# Patient Record
Sex: Male | Born: 1990
Health system: Southern US, Community
[De-identification: ages and names within clinical notes are randomized; demographics above are authoritative.]

## PROBLEM LIST (undated history)

## (undated) HISTORY — PX: WISDOM TOOTH EXTRACTION: SHX21

## (undated) HISTORY — PX: ORCHIOPEXY: SHX479

---
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
## 2014-08-13 ENCOUNTER — Telehealth: Payer: Self-pay | Admitting: Medical

## 2014-08-13 NOTE — Telephone Encounter (Signed)
Toniann Fail did not get message in her box so she is going to give to [employer]

## 2014-08-13 NOTE — Telephone Encounter (Signed)
Received records from previous doc, [facility]. I am sending records back to be reviewed and the forwarded to shane.

## 2014-08-19 ENCOUNTER — Encounter: Payer: Self-pay | Admitting: Medical

## 2014-08-19 ENCOUNTER — Ambulatory Visit (INDEPENDENT_AMBULATORY_CARE_PROVIDER_SITE_OTHER): Payer: No Typology Code available for payment source | Admitting: Medical

## 2014-08-19 VITALS — BP 130/70 | HR 68 | Temp 97.6°F | Resp 16 | Ht 74.0 in | Wt 233.0 lb

## 2014-08-19 DIAGNOSIS — Z113 Encounter for screening for infections with a predominantly sexual mode of transmission: Secondary | ICD-10-CM

## 2014-08-19 DIAGNOSIS — K644 Residual hemorrhoidal skin tags: Secondary | ICD-10-CM

## 2014-08-19 DIAGNOSIS — Z23 Encounter for immunization: Secondary | ICD-10-CM

## 2014-08-19 DIAGNOSIS — Z0000 Encounter for general adult medical examination without abnormal findings: Secondary | ICD-10-CM

## 2014-08-19 DIAGNOSIS — K6289 Other specified diseases of anus and rectum: Secondary | ICD-10-CM

## 2014-08-19 DIAGNOSIS — B360 Pityriasis versicolor: Secondary | ICD-10-CM

## 2014-08-19 LAB — CBC
HCT: 42.8 % (ref 39.0–52.0)
HEMOGLOBIN: 15.1 g/dL (ref 13.0–17.0)
MCH: 30 pg (ref 26.0–34.0)
MCHC: 35.3 g/dL (ref 30.0–36.0)
MCV: 85.1 fL (ref 78.0–100.0)
Platelets: 330 10*3/uL (ref 150–400)
RBC: 5.03 MIL/uL (ref 4.22–5.81)
RDW: 13.1 % (ref 11.5–15.5)
WBC: 4.5 10*3/uL (ref 4.0–10.5)

## 2014-08-19 LAB — POCT URINALYSIS DIPSTICK
BILIRUBIN UA: NEGATIVE
GLUCOSE UA: NEGATIVE
Ketones, UA: NEGATIVE
LEUKOCYTES UA: NEGATIVE
NITRITE UA: NEGATIVE
RBC UA: NEGATIVE
SPEC GRAV UA: 1.025
Urobilinogen, UA: NEGATIVE
pH, UA: 5

## 2014-08-19 MED ORDER — HYDROCORTISONE 2.5 % RE CREA: 1.0000 "application " | TOPICAL_CREAM | Freq: Two times a day (BID) | RECTAL | Status: DC

## 2014-08-19 MED ORDER — KETOCONAZOLE 2 % EX SHAM: 1.0000 "application " | MEDICATED_SHAMPOO | CUTANEOUS | Status: DC

## 2014-08-19 MED ORDER — HYDROCORTISONE ACETATE 25 MG RE SUPP: 25.0000 mg | Freq: Two times a day (BID) | RECTAL | Status: DC

## 2014-08-19 MED ORDER — TERBINAFINE HCL 250 MG PO TABS: 250.0000 mg | ORAL_TABLET | Freq: Every day | ORAL | Status: DC

## 2014-08-19 NOTE — Patient Instructions (Signed)
  Thank you for giving me the opportunity to serve you today.    Your diagnosis today includes: Encounter Diagnoses  Name Primary?  . Routine general medical examination at a health care facility Yes  . Tinea versicolor   . Rectal pain   . External hemorrhoid   . Need for prophylactic vaccination and inoculation against influenza   . Screen for STD (sexually transmitted disease)      Specific recommendations today include:  For fungal rash, begin Lamisil tablet daily;  Use the Nizoral shampoo twice per week.  Apply to head, chest, torso, arms, leave on for 10 minutes, then rinse off with water . Do this twice weekly.  Rectal pain - begin Anusol suppository twice daily for the next 5-7 days.  You may use the cream to the anus (proctosol) as needed.    Clean well after stooling.  If constipated, use OTC stool softener occasionally.  Drink plenty of water daily, get good fiber in the diet daily.  Use condoms.  We will call with lab results.   Recheck pending labs, follow up in 1 months

## 2014-08-19 NOTE — Progress Notes (Signed)
Subjective:   HPI  Ethan Rowland is a 23 y.o. male who presents for a complete physical.  New patient today.  Establishing care here today along with his twin brother who I also saw today.   Lives with mother and brother. Father unknown.  Has been in usual state of health   Preventative care: Last ophthalmology visit:n/a Last dental visit:yes Prior vaccinations: TD or Tdap:2009 Influenza:08/19/14  Concerns: Hurts to have BM, usually pain starts a little after BMs.  Started few months with painful straining, saw some bright red blood. No prior hemorrhoid. Lately has rectal pain after every BM, but no mucous, no blood now, no obvious hemorrhoid, no constipation, usually has 1 BM daily.  No prior similar.  Worried he has a fissure.     Has concerns of ongoing rash since high school.  Rash flares up from time to time worse but is always present. Initially started on the neck and upper back but now includes the whole torso neck and arms. Has used A&D ointment with no improvement  Not currently sexually active, greater than one sexual partner lifetime, mostly uses condoms but not always, last STD testing years ago  Reviewed their medical, surgical, family, social, medication, and allergy history and updated chart as appropriate.  History reviewed. No pertinent past medical history.  Past Surgical History  Procedure Laterality Date  . Orchiopexy      bilat, in childhood for undescended testicles  . Wisdom tooth extraction      History   Social History  . Marital Status: Single    Spouse Name: N/A    Number of Children: N/A  . Years of Education: N/A   Occupational History  . Not on file.   Social History Main Topics  . Smoking status: Former Smoker -- 1 years  . Smokeless tobacco: Not on file  . Alcohol Use: No  . Drug Use: No  . Sexual Activity: Not on file   Other Topics Concern  . Not on file   Social History Narrative   [employer] for Designer, fashion/clothing, freshman.    Exercise 4 days per week each.   No girlfriend.  Lives with mom and brother.    History reviewed. No pertinent family history.  Current outpatient prescriptions:hydrocortisone (ANUSOL-HC) 2.5 % rectal cream, Place 1 application rectally 2 (two) times daily., Disp: 30 g, Rfl: 0;  hydrocortisone (ANUSOL-HC) 25 MG suppository, Place 1 suppository (25 mg total) rectally 2 (two) times daily., Disp: 12 suppository, Rfl: 0;  ketoconazole (NIZORAL) 2 % shampoo, Apply 1 application topically 2 (two) times a week., Disp: 120 mL, Rfl: 0 terbinafine (LAMISIL) 250 MG tablet, Take 1 tablet (250 mg total) by mouth daily., Disp: 30 tablet, Rfl: 0  No Known Allergies    Review of Systems Constitutional: -fever, -chills, -sweats, -unexpected weight change, -decreased appetite, -fatigue Allergy: -sneezing, -itching, -congestion Dermatology: -changing moles, +rash, -lumps ENT: -runny nose, -ear pain, -sore throat, -hoarseness, -sinus pain, -teeth pain, - ringing in ears, -hearing loss, -nosebleeds Cardiology: -chest pain, -palpitations, -swelling, -difficulty breathing when lying flat, -waking up short of breath Respiratory: -cough, -shortness of breath, -difficulty breathing with exercise or exertion, -wheezing, -coughing up blood Gastroenterology: -abdominal pain, -nausea, -vomiting, -diarrhea, -constipation, -blood in stool, -changes in bowel movement, -difficulty swallowing or eating Hematology: -bleeding, -bruising  Musculoskeletal: -joint aches, -muscle aches, -joint swelling, -back pain, -neck pain, -cramping, -changes in gait Ophthalmology: denies vision changes, eye redness, itching, discharge Urology: -burning with urination, -difficulty urinating, -blood  in urine, -urinary frequency, -urgency, -incontinence Neurology: -headache, -weakness, -tingling, -numbness, -memory loss, -falls, -dizziness Psychology: -depressed mood, -agitation, -sleep problems     Objective:   Physical Exam  BP  130/70  Pulse 68  Temp(Src) 97.6 F (36.4 C) (Oral)  Resp 16  Ht  (1.88 m)  Wt 233 lb (105.688 kg)  BMI 29.90 kg/m2  General appearance: alert, no distress, WD/WN, AA male, pleasant Skin: diffuse patches of round, various sized flat lesions throughout neck, torso anterior and posterior, antecubital regions, all suggestive of tinea versicolor, no other worrisome lesions HEENT: normocephalic, conjunctiva/corneas normal, sclerae anicteric, PERRLA, EOMi, nares patent, no discharge or erythema, pharynx normal Oral cavity: MMM, tongue normal, teeth in good repair Neck: supple, no lymphadenopathy, no thyromegaly, no masses, normal ROM, no bruits Chest: non tender, normal shape and expansion Heart: RRR, normal S1, S2, no murmurs Lungs: CTA bilaterally, no wheezes, rhonchi, or rales Abdomen: +bs, soft, non tender, non distended, no masses, no hepatomegaly, no splenomegaly, no bruits Back: non tender, normal ROM, no scoliosis Musculoskeletal: upper extremities non tender, no obvious deformity, normal ROM throughout, lower extremities non tender, no obvious deformity, normal ROM throughout Extremities: no edema, no cyanosis, no clubbing Pulses: 2+ symmetric, upper and lower extremities, normal cap refill Neurological: alert, oriented x 3, CN2-12 intact, strength normal upper extremities and lower extremities, sensation normal throughout, DTRs 2+ throughout, no cerebellar signs, gait normal Psychiatric: normal affect, behavior normal, pleasant  GU: normal male external genitalia, circumcised, surgical scar noted of inferior testes, otherwise nontender, no masses, no hernia, no lymphadenopathy Rectal: anus inflamed appearing mildly, small somewhat tender external hemorrhoid, there is a pit at lower end of gluteal cleft, no other abnormality   Assessment and Plan :    Encounter Diagnoses  Name Primary?  . Routine general medical examination at a health care facility Yes  . Tinea versicolor    . Rectal pain   . External hemorrhoid   . Need for prophylactic vaccination and inoculation against influenza   . Need for HPV vaccination   . Screen for STD (sexually transmitted disease)     Physical exam - discussed healthy lifestyle, diet, exercise, preventative care, vaccinations, and addressed their concerns.  Advise he see dentist yearly, discussed hygiene, safety, college work and career goals. Baseline labs today for screening, reported history of impaired glucose  Tinea versicolor-discussed the findings, diagnosis, treatment, begin Nizoral shampoo twice weekly, Lamisil oral tablets daily, recheck in one month  Rectal pain, external hemorrhoid-discussed good hygiene, avoidance of constipation, stool softener as needed over-the-counter, begin short-term Anusol suppository, hydrocortisone cream rectally as needed, discussed good water intake and fiber intake in the diet, recheck in 1 month, sooner when necessary  Discussed condom use, safe sex, prevention, STDs, means of transmission, prevention of unwanted pregnancy, respect for women.  STD screening labs today.   Patient gives consent for testing.  We will call with lab results.   Counseled on the influenza virus vaccine.  Vaccine information sheet given.  Influenza vaccine given after consent obtained.  Counseled on the Human Papilloma virus vaccine.  Vaccine information sheet given.  HPV #3 vaccine given after consent obtained.     Follow-up pending labs, 20mo on tinea and rectal pain.

## 2014-08-20 LAB — COMPREHENSIVE METABOLIC PANEL
ALBUMIN: 4.8 g/dL (ref 3.5–5.2)
ALT: 18 U/L (ref 0–53)
AST: 15 U/L (ref 0–37)
Alkaline Phosphatase: 84 U/L (ref 39–117)
BUN: 9 mg/dL (ref 6–23)
CALCIUM: 10 mg/dL (ref 8.4–10.5)
CO2: 27 mEq/L (ref 19–32)
CREATININE: 0.89 mg/dL (ref 0.50–1.35)
Chloride: 104 mEq/L (ref 96–112)
GLUCOSE: 90 mg/dL (ref 70–99)
Potassium: 4.1 mEq/L (ref 3.5–5.3)
Sodium: 138 mEq/L (ref 135–145)
TOTAL PROTEIN: 7 g/dL (ref 6.0–8.3)
Total Bilirubin: 0.4 mg/dL (ref 0.2–1.2)

## 2014-08-20 LAB — LIPID PANEL
CHOLESTEROL: 152 mg/dL (ref 0–200)
HDL: 43 mg/dL (ref >39)
LDL Cholesterol: 73 mg/dL (ref 0–99)
TRIGLYCERIDES: 180 mg/dL — AB (ref <150)
Total CHOL/HDL Ratio: 3.5 Ratio
VLDL: 36 mg/dL (ref 0–40)

## 2014-08-20 LAB — HIV ANTIBODY (ROUTINE TESTING W REFLEX): HIV 1&2 Ab, 4th Generation: NONREACTIVE

## 2014-08-20 LAB — RPR

## 2014-08-20 LAB — GC/CHLAMYDIA PROBE AMP
CT Probe RNA: NEGATIVE
GC Probe RNA: NEGATIVE

## 2014-08-20 LAB — TSH: TSH: 1.993 u[IU]/mL (ref 0.350–4.500)

## 2014-09-09 ENCOUNTER — Encounter: Payer: Self-pay | Admitting: Medical

## 2014-09-10 NOTE — Telephone Encounter (Signed)
09/10/2014 °

## 2017-09-04 ENCOUNTER — Encounter: Payer: Self-pay | Admitting: Medical

## 2017-09-04 ENCOUNTER — Ambulatory Visit (INDEPENDENT_AMBULATORY_CARE_PROVIDER_SITE_OTHER): Payer: BLUE CROSS/BLUE SHIELD | Admitting: Medical

## 2017-09-04 VITALS — BP 132/80 | HR 87 | Ht 74.0 in | Wt 243.6 lb

## 2017-09-04 DIAGNOSIS — Z0000 Encounter for general adult medical examination without abnormal findings: Secondary | ICD-10-CM

## 2017-09-04 DIAGNOSIS — Z7189 Other specified counseling: Secondary | ICD-10-CM

## 2017-09-04 DIAGNOSIS — H579 Unspecified disorder of eye and adnexa: Secondary | ICD-10-CM | POA: Diagnosis not present

## 2017-09-04 DIAGNOSIS — R109 Unspecified abdominal pain: Secondary | ICD-10-CM

## 2017-09-04 DIAGNOSIS — B360 Pityriasis versicolor: Secondary | ICD-10-CM

## 2017-09-04 DIAGNOSIS — Z113 Encounter for screening for infections with a predominantly sexual mode of transmission: Secondary | ICD-10-CM | POA: Diagnosis not present

## 2017-09-04 DIAGNOSIS — Z7185 Encounter for immunization safety counseling: Secondary | ICD-10-CM

## 2017-09-04 LAB — POCT URINALYSIS DIP (PROADVANTAGE DEVICE)
BILIRUBIN UA: NEGATIVE
Blood, UA: NEGATIVE
GLUCOSE UA: NEGATIVE mg/dL
Ketones, POC UA: NEGATIVE mg/dL
LEUKOCYTES UA: NEGATIVE
NITRITE UA: NEGATIVE
Specific Gravity, Urine: 1.03
Urobilinogen, Ur: POSITIVE
pH, UA: 6 (ref 5.0–8.0)

## 2017-09-04 MED ORDER — KETOCONAZOLE 2 % EX SHAM: 1.0000 "application " | MEDICATED_SHAMPOO | CUTANEOUS | 1 refills | Status: DC

## 2017-09-04 NOTE — Progress Notes (Signed)
Subjective:   HPI  Ethan Rowland is a 26 y.o. male who presents for a complete physical.    Concerns: sexually active, same partner last 3 months.  No current worrisome symptoms.   He reports intermittent tingling in abdomen.  Worse when he is quiet, but its there daily for past year.    Gets stomach turning, unusual feeling in stomach.  Denies worry, anxiety.  No diarrhea, no constipation, no blood in stool, no back pain.   Eats 3 times daily.  Mainly drinks water.   Eats a lot of fast food.  Not exercising.  Works for [employer], Education officer, environmental.    Has some new brown spots in his white of eye  Reviewed their medical, surgical, family, social, medication, and allergy history and updated chart as appropriate.  No past medical history on file.  Past Surgical History:  Procedure Laterality Date  . ORCHIOPEXY     bilat, in childhood for undescended testicles  . WISDOM TOOTH EXTRACTION      Social History   Social History  . Marital status: Single    Spouse name: N/A  . Number of children: N/A  . Years of education: N/A   Occupational History  . Not on file.   Social History Main Topics  . Smoking status: Former Smoker    Years: 1.00  . Smokeless tobacco: Never Used  . Alcohol use No  . Drug use: No  . Sexual activity: Not on file   Other Topics Concern  . Not on file   Social History Narrative   [employer] for Designer, fashion/clothing, freshman.   Exercise 4 days per week each.   No girlfriend.  Lives with mom and brother.    No family history on file.   Current Outpatient Prescriptions:  .  ketoconazole (NIZORAL) 2 % shampoo, Apply 1 application topically 2 (two) times a week. (Patient not taking: Reported on 09/04/2017), Disp: 120 mL, Rfl: 0 .  terbinafine (LAMISIL) 250 MG tablet, Take 1 tablet (250 mg total) by mouth daily. (Patient not taking: Reported on 09/04/2017), Disp: 30 tablet, Rfl: 0  No Known Allergies    Review of Systems Constitutional: -fever, -chills,  -sweats, -unexpected weight change, -decreased appetite, -fatigue Allergy: -sneezing, -itching, -congestion Dermatology: -changing moles, +rash, -lumps ENT: -runny nose, -ear pain, -sore throat, -hoarseness, -sinus pain, -teeth pain, - ringing in ears, -hearing loss, -nosebleeds Cardiology: -chest pain, -palpitations, -swelling, -difficulty breathing when lying flat, -waking up short of breath Respiratory: -cough, -shortness of breath, -difficulty breathing with exercise or exertion, -wheezing, -coughing up blood Gastroenterology: -abdominal pain, -nausea, -vomiting, -diarrhea, -constipation, -blood in stool, -changes in bowel movement, -difficulty swallowing or eating Hematology: -bleeding, -bruising  Musculoskeletal: -joint aches, -muscle aches, -joint swelling, -back pain, -neck pain, -cramping, -changes in gait Ophthalmology: denies vision changes, eye redness, itching, discharge Urology: -burning with urination, -difficulty urinating, -blood in urine, -urinary frequency, -urgency, -incontinence Neurology: -headache, -weakness, -tingling, -numbness, -memory loss, -falls, -dizziness Psychology: -depressed mood, -agitation, -sleep problems     Objective:   Physical Exam  BP 132/80   Pulse 87   Ht  (1.88 m)   Wt 243 lb 9.6 oz (110.5 kg)   SpO2 99%   BMI 31.28 kg/m   General appearance: alert, no distress, WD/WN, AA male, pleasant Skin: few round 3mm - 1cm patches along back  suggestive of tinea versicolor, no other worrisome lesions HEENT: normocephalic, conjunctiva/corneas normal, sclerae anicteric, few brown flat patches of bilat sclera,  PERRLA, EOMi,  nares patent, no discharge or erythema, pharynx normal Oral cavity: MMM, tongue normal, teeth in good repair Neck: supple, no lymphadenopathy, no thyromegaly, no masses, normal ROM, no bruits Chest: non tender, normal shape and expansion Heart: RRR, normal S1, S2, no murmurs Lungs: CTA bilaterally, no wheezes, rhonchi, or  rales Abdomen: +bs, soft, non tender, non distended, no masses, no hepatomegaly, no splenomegaly, no bruits Back: non tender, normal ROM, no scoliosis Musculoskeletal: upper extremities non tender, no obvious deformity, normal ROM throughout, lower extremities non tender, no obvious deformity, normal ROM throughout Extremities: no edema, no cyanosis, no clubbing Pulses: 2+ symmetric, upper and lower extremities, normal cap refill Neurological: alert, oriented x 3, CN2-12 intact, strength normal upper extremities and lower extremities, sensation normal throughout, DTRs 2+ throughout, no cerebellar signs, gait normal Psychiatric: normal affect, behavior normal, pleasant  GU: normal male external genitalia, circumcised, surgical scar noted of inferior testes, otherwise nontender, no masses, no hernia, no lymphadenopathy Rectal: deferred   Assessment and Plan :    Encounter Diagnoses  Name Primary?  . Routine general medical examination at a health care facility Yes  . Screen for STD (sexually transmitted disease)   . Abdominal discomfort   . Eye lesion   . Vaccine counseling   . Tinea versicolor     Physical exam - discussed healthy lifestyle, diet, exercise, preventative care, vaccinations, and addressed their concerns.    Tinea versicolor-discussed the findings, diagnosis, treatment, begin Nizoral shampoo twice weekly for a month then prn  Discussed condom use, safe sex, prevention, STDs, means of transmission, prevention of unwanted pregnancy  See eye doctor regarding eye lesions  Abdominal discomfort - etiology unclear, likely related to the fact he eats fast food daily.  counseled on healthy diet, exercise.   F/u pending labs  Vaccines: Reviewed [facility]   Declines flu shot  Due for Td next year.  Follow-up pending labs  [name] was seen today for annual exam.  Diagnoses and all orders for this visit:  Routine general medical examination at a health care facility -      POCT Urinalysis DIP (Proadvantage Device) -     CBC -     Comprehensive metabolic panel -     Hemoglobin A1c -     HIV antibody -     RPR -     C. trachomatis/N. gonorrhoeae RNA  Screen for STD (sexually transmitted disease) -     HIV antibody -     RPR -     C. trachomatis/N. gonorrhoeae RNA  Abdominal discomfort  Eye lesion  Vaccine counseling  Tinea versicolor

## 2017-09-04 NOTE — Patient Instructions (Signed)
Ophthalmology Dr. Glenford Peers [address] Felipa [place], [place] [address] [phone]   [facility] Dr. Gelene Mink [address], [place] [place], [place] [address]  [phone] [email or web address]   Vincenza Hews, M.D. Susanne Greenhouse, O.D. [address] [place], [place] [address] Medical telephone: [phone] Optical telephone: [phone]   Health Maintenance, Male A healthy lifestyle and preventive care is important for your health and wellness. Ask your health care provider about what schedule of regular examinations is right for you. What should I know about weight and diet? Eat a Healthy Diet  Eat plenty of vegetables, fruits, whole grains, low-fat dairy products, and lean protein.  Do not eat a lot of foods high in solid fats, added sugars, or salt.  Maintain a Healthy Weight Regular exercise can help you achieve or maintain a healthy weight. You should:  Do at least 150 minutes of exercise each week. The exercise should increase your heart rate and make you sweat (moderate-intensity exercise).  Do strength-training exercises at least twice a week.  Watch Your Levels of Cholesterol and Blood Lipids  Have your blood tested for lipids and cholesterol every 5 years starting at 24 years of age. If you are at high risk for heart disease, you should start having your blood tested when you are 24 years old. You may need to have your cholesterol levels checked more often if: ? Your lipid or cholesterol levels are high. ? You are older than 24 years of age. ? You are at high risk for heart disease.  What should I know about cancer screening? Many types of cancers can be detected early and may often be prevented. Lung Cancer  You should be screened every year for lung cancer if: ? You are a current smoker who has smoked for at least 30 years. ? You are a former smoker who has quit within the past 15 years.  Talk to your health care  provider about your screening options, when you should start screening, and how often you should be screened.  Colorectal Cancer  Routine colorectal cancer screening usually begins at 24 years of age and should be repeated every 5-10 years until you are 24 years old. You may need to be screened more often if early forms of precancerous polyps or small growths are found. Your health care provider may recommend screening at an earlier age if you have risk factors for colon cancer.  Your health care provider may recommend using home test kits to check for hidden blood in the stool.  A small camera at the end of a tube can be used to examine your colon (sigmoidoscopy or colonoscopy). This checks for the earliest forms of colorectal cancer.  Prostate and Testicular Cancer  Depending on your age and overall health, your health care provider may do certain tests to screen for prostate and testicular cancer.  Talk to your health care provider about any symptoms or concerns you have about testicular or prostate cancer.  Skin Cancer  Check your skin from head to toe regularly.  Tell your health care provider about any new moles or changes in moles, especially if: ? There is a change in a mole's size, shape, or color. ? You have a mole that is larger than a pencil eraser.  Always use sunscreen. Apply sunscreen liberally and repeat throughout the day.  Protect yourself by wearing long sleeves, pants, a wide-brimmed hat, and sunglasses when outside.  What should  I know about heart disease, diabetes, and high blood pressure?  If you are 42-3 years of age, have your blood pressure checked every 3-5 years. If you are 9 years of age or older, have your blood pressure checked every year. You should have your blood pressure measured twice-once when you are at a hospital or clinic, and once when you are not at a hospital or clinic. Record the average of the two measurements. To check your blood pressure  when you are not at a hospital or clinic, you can use: ? An automated blood pressure machine at a pharmacy. ? A home blood pressure monitor.  Talk to your health care provider about your target blood pressure.  If you are between 101-70 years old, ask your health care provider if you should take aspirin to prevent heart disease.  Have regular diabetes screenings by checking your fasting blood sugar level. ? If you are at a normal weight and have a low risk for diabetes, have this test once every three years after the age of 86. ? If you are overweight and have a high risk for diabetes, consider being tested at a younger age or more often.  A one-time screening for abdominal aortic aneurysm (AAA) by ultrasound is recommended for men aged 65-75 years who are current or former smokers. What should I know about preventing infection? Hepatitis B If you have a higher risk for hepatitis B, you should be screened for this virus. Talk with your health care provider to find out if you are at risk for hepatitis B infection. Hepatitis C Blood testing is recommended for:  Everyone born from [birth year] through 1963.  Anyone with known risk factors for hepatitis C.  Sexually Transmitted Diseases (STDs)  You should be screened each year for STDs including gonorrhea and chlamydia if: ? You are sexually active and are younger than 24 years of age. ? You are older than 24 years of age and your health care provider tells you that you are at risk for this type of infection. ? Your sexual activity has changed since you were last screened and you are at an increased risk for chlamydia or gonorrhea. Ask your health care provider if you are at risk.  Talk with your health care provider about whether you are at high risk of being infected with HIV. Your health care provider may recommend a prescription medicine to help prevent HIV infection.  What else can I do?  Schedule regular health, dental, and eye  exams.  Stay current with your vaccines (immunizations).  Do not use any tobacco products, such as cigarettes, chewing tobacco, and e-cigarettes. If you need help quitting, ask your health care provider.  Limit alcohol intake to no more than 2 drinks per day. One drink equals 12 ounces of beer, 5 ounces of wine, or 1 ounces of hard liquor.  Do not use street drugs.  Do not share needles.  Ask your health care provider for help if you need support or information about quitting drugs.  Tell your health care provider if you often feel depressed.  Tell your health care provider if you have ever been abused or do not feel safe at home. This information is not intended to replace advice given to you by your health care provider. Make sure you discuss any questions you have with your health care provider. Document Released: 05/25/2008 Document Revised: 07/26/2016 Document Reviewed: 08/31/2015 Elsevier Interactive Patient Education  [employer].

## 2017-09-05 LAB — C. TRACHOMATIS/N. GONORRHOEAE RNA
C. trachomatis RNA, TMA: NOT DETECTED
N. GONORRHOEAE RNA, TMA: NOT DETECTED

## 2017-09-05 LAB — COMPREHENSIVE METABOLIC PANEL
AG Ratio: 1.9 (calc) (ref 1.0–2.5)
ALBUMIN MSPROF: 4.8 g/dL (ref 3.6–5.1)
ALKALINE PHOSPHATASE (APISO): 51 U/L (ref 40–115)
ALT: 23 U/L (ref 9–46)
AST: 23 U/L (ref 10–40)
BILIRUBIN TOTAL: 0.5 mg/dL (ref 0.2–1.2)
BUN: 10 mg/dL (ref 7–25)
CHLORIDE: 104 mmol/L (ref 98–110)
CO2: 23 mmol/L (ref 20–32)
CREATININE: 0.91 mg/dL (ref 0.60–1.35)
Calcium: 9.7 mg/dL (ref 8.6–10.3)
GLOBULIN: 2.5 g/dL (ref 1.9–3.7)
Glucose, Bld: 86 mg/dL (ref 65–99)
POTASSIUM: 4.2 mmol/L (ref 3.5–5.3)
Sodium: 139 mmol/L (ref 135–146)
Total Protein: 7.3 g/dL (ref 6.1–8.1)

## 2017-09-05 LAB — HIV ANTIBODY (ROUTINE TESTING W REFLEX): HIV: NONREACTIVE

## 2017-09-05 LAB — CBC
HEMATOCRIT: 44.3 % (ref 38.5–50.0)
HEMOGLOBIN: 15.2 g/dL (ref 13.2–17.1)
MCH: 29.3 pg (ref 27.0–33.0)
MCHC: 34.3 g/dL (ref 32.0–36.0)
MCV: 85.5 fL (ref 80.0–100.0)
MPV: 10.6 fL (ref 7.5–12.5)
Platelets: 342 10*3/uL (ref 140–400)
RBC: 5.18 10*6/uL (ref 4.20–5.80)
RDW: 12.1 % (ref 11.0–15.0)
WBC: 5.2 10*3/uL (ref 3.8–10.8)

## 2017-09-05 LAB — RPR: RPR Ser Ql: NONREACTIVE

## 2017-09-05 LAB — HEMOGLOBIN A1C
HEMOGLOBIN A1C: 5.5 %{Hb} (ref <5.7)
Mean Plasma Glucose: 111 (calc)
eAG (mmol/L): 6.2 (calc)

## 2017-09-12 ENCOUNTER — Other Ambulatory Visit (INDEPENDENT_AMBULATORY_CARE_PROVIDER_SITE_OTHER): Payer: BLUE CROSS/BLUE SHIELD

## 2017-09-12 DIAGNOSIS — R8290 Unspecified abnormal findings in urine: Secondary | ICD-10-CM

## 2017-09-12 LAB — POCT URINALYSIS DIP (PROADVANTAGE DEVICE)
BILIRUBIN UA: NEGATIVE
Glucose, UA: NEGATIVE mg/dL
Ketones, POC UA: NEGATIVE mg/dL
Leukocytes, UA: NEGATIVE
NITRITE UA: NEGATIVE
PH UA: 6 (ref 5.0–8.0)
Protein Ur, POC: NEGATIVE mg/dL
RBC UA: NEGATIVE
SPECIFIC GRAVITY, URINE: 1.03
UUROB: NEGATIVE

## 2018-04-29 ENCOUNTER — Encounter (HOSPITAL_COMMUNITY): Payer: Self-pay | Admitting: Emergency Medicine

## 2018-04-29 ENCOUNTER — Ambulatory Visit (HOSPITAL_COMMUNITY)
Admission: EM | Admit: 2018-04-29 | Discharge: 2018-04-29 | Disposition: A | Discharge status: Home / Self Care | Payer: BLUE CROSS/BLUE SHIELD | Attending: Family Medicine | Admitting: Family Medicine

## 2018-04-29 DIAGNOSIS — M25572 Pain in left ankle and joints of left foot: Secondary | ICD-10-CM

## 2018-04-29 DIAGNOSIS — M25571 Pain in right ankle and joints of right foot: Secondary | ICD-10-CM

## 2018-04-29 MED ORDER — NAPROXEN 500 MG PO TABS: 500.0000 mg | ORAL_TABLET | Freq: Two times a day (BID) | ORAL | 0 refills | Status: DC

## 2018-04-29 NOTE — Discharge Instructions (Signed)
Ice, elevation, ace wrap or ankle sleeve for compression and support to ankle/feet. Naproxen twice a day for pain control. Activity as tolerated. Your shoes may need better support while playing basketball.

## 2018-04-29 NOTE — ED Triage Notes (Signed)
Pt sts bilateral foot pain after playing basketball

## 2018-04-29 NOTE — ED Provider Notes (Signed)
[facility]    CSN: [id number] Arrival date & time: 04/29/18  1210     History   Chief Complaint Chief Complaint  Patient presents with  . Foot Pain    HPI RANDI COLLEGE is a 27 y.o. male.   Izaia presents with complaints of bilateral foot pain after he played basketball 5/14. No specific injury. Pain followed on 5/15. Pain is worse if he is stepping up such on a stair, but not with simple ambulation. Denies any previous injury. Denies numbness or tingling. Has not taken any medications for pain. Has not worsened but has not improved. Minimal swelling. Right foot greater than left. Without contributing medical history.      ROS per HPI.      History reviewed. No pertinent past medical history.  Patient Active Problem List   Diagnosis Date Noted  . Screen for STD (sexually transmitted disease) 09/04/2017  . Eye lesion 09/04/2017  . Abdominal discomfort 09/04/2017  . Tinea versicolor 09/04/2017  . Vaccine counseling 09/04/2017    Past Surgical History:  Procedure Laterality Date  . ORCHIOPEXY     bilat, in childhood for undescended testicles  . WISDOM TOOTH EXTRACTION         Home Medications    Prior to Admission medications   Medication Sig Start Date End Date Taking? Authorizing Provider  ketoconazole (NIZORAL) 2 % shampoo Apply 1 application topically 2 (two) times a week. 09/06/17   Tysinger, Kermit Balo, PA-C  naproxen (NAPROSYN) 500 MG tablet Take 1 tablet (500 mg total) by mouth 2 (two) times daily. 04/29/18   Georgetta Haber, NP    Family History History reviewed. No pertinent family history.  Social History Social History   Tobacco Use  . Smoking status: Former Smoker    Years: 1.00  . Smokeless tobacco: Never Used  Substance Use Topics  . Alcohol use: No  . Drug use: No     Allergies   Patient has no known allergies.   Review of Systems Review of Systems   Physical Exam Triage Vital Signs ED Triage Vitals  [04/29/18 1343]  Enc Vitals Group     BP (!) 143/73     Pulse Rate (!) 49     Resp 18     Temp 97.9 F (36.6 C)     Temp Source Oral     SpO2 100 %     Weight      Height      Head Circumference      Peak Flow      Pain Score      Pain Loc      Pain Edu?      Excl. in GC?    No data found.  Updated Vital Signs BP (!) 143/73 (BP Location: Left Arm)   Pulse (!) 49   Temp 97.9 F (36.6 C) (Oral)   Resp 18   SpO2 100%    Physical Exam  Constitutional: He is oriented to person, place, and time. He appears well-developed and well-nourished.  Cardiovascular: Normal rate and regular rhythm.  Pulmonary/Chest: Effort normal and breath sounds normal.  Musculoskeletal:       Right ankle: He exhibits normal range of motion, no swelling, no ecchymosis, no deformity, no laceration and normal pulse. Tenderness. AITFL tenderness found. Achilles tendon normal.       Left ankle: He exhibits normal range of motion, no swelling, no ecchymosis, no deformity, no laceration and normal pulse. Tenderness. AITFL  tenderness found. Achilles tendon normal.       Right foot: Normal.       Left foot: Normal.       Feet:  Mild muscular tenderness to bilateral ankles; without bony tenderness; strong pedal pulses; full ROm to ankle; sensation intact bilaterally; cap refill <2seconds  Neurological: He is alert and oriented to person, place, and time.  Skin: Skin is warm and dry.     UC Treatments / Results  Labs (all labs ordered are listed, but only abnormal results are displayed) Labs Reviewed - No data to display  EKG None  Radiology No results found.  Procedures Procedures (including critical care time)  Medications Ordered in UC Medications - No data to display  Initial Impression / Assessment and Plan / UC Course  I have reviewed the triage vital signs and the nursing notes.  Pertinent labs & imaging results that were available during my care of the patient were reviewed by me and  considered in my medical decision making (see chart for details).     No specific injury. Has not tried any medications for symptoms. Strain vs tendonitis. ACE wraps applied. Ice, elevation, NSAIDs for pain control. Activity as tolerated. Return precautions provided. Patient verbalized understanding and agreeable to plan.  Ambulatory out of clinic without difficulty.    Final Clinical Impressions(s) / UC Diagnoses   Final diagnoses:  Acute bilateral ankle pain     Discharge Instructions     Ice, elevation, ace wrap or ankle sleeve for compression and support to ankle/feet. Naproxen twice a day for pain control. Activity as tolerated. Your shoes may need better support while playing basketball.    ED Prescriptions    Medication Sig Dispense Auth. Provider   naproxen (NAPROSYN) 500 MG tablet Take 1 tablet (500 mg total) by mouth 2 (two) times daily. 30 tablet Georgetta Haber, NP     Controlled Substance Prescriptions [place] Controlled Substance Registry consulted? Not Applicable   Georgetta Haber, NP 04/29/18 1439

## 2018-10-06 ENCOUNTER — Encounter (HOSPITAL_COMMUNITY): Payer: Self-pay | Admitting: *Deleted

## 2018-10-06 ENCOUNTER — Emergency Department (HOSPITAL_COMMUNITY)
Admission: EM | Admit: 2018-10-06 | Discharge: 2018-10-06 | Disposition: A | Discharge status: Home / Self Care | Payer: BLUE CROSS/BLUE SHIELD | Attending: Emergency Medicine | Admitting: Emergency Medicine

## 2018-10-06 ENCOUNTER — Other Ambulatory Visit: Payer: Self-pay

## 2018-10-06 DIAGNOSIS — H6123 Impacted cerumen, bilateral: Secondary | ICD-10-CM | POA: Insufficient documentation

## 2018-10-06 MED ORDER — HYDROGEN PEROXIDE 3 % EX SOLN
CUTANEOUS | Status: AC
  Administered 2018-10-06: 12:00:00
  Filled 2018-10-06: qty 473

## 2018-10-06 NOTE — ED Provider Notes (Signed)
Ethan Rowland is a 27 y.o. male with decreased hearing due to wax in ears.  BP (!) 155/77 (BP Location: Right Arm)   Pulse 70   Temp 98.1 F (36.7 C) (Oral)   Resp 16   Ht 6\' 3"  (1.905 m)   Wt 97.5 kg   SpO2 98%   BMI 26.87 kg/m   Re examined patient after RN irrigated ears. Cerumen removed and patient reports hearing has returned to normal. TM's normal.   Patient to f/u with PCP.    Kerrie Buffalo [place], [place] 10/06/18 1225    Lorre Nick, MD 10/09/18 [phone]

## 2018-10-06 NOTE — ED Provider Notes (Signed)
[facility]EM[facility]ENC[facility]PT Provider Note   CSN: [id number] Arrival date & time: 10/06/18  1047     History   Chief Complaint Chief Complaint  Patient presents with  . Hearing Loss    HPI Ethan Rowland is a 27 y.o. male.  27 year old male complains of decreased hearing in his left ear.  Patient does use Q-tips.  Denies any ear drainage.  No fever.  No treatment used prior to arrival     History reviewed. No pertinent past medical history.  Patient Active Problem List   Diagnosis Date Noted  . Screen for STD (sexually transmitted disease) 09/04/2017  . Eye lesion 09/04/2017  . Abdominal discomfort 09/04/2017  . Tinea versicolor 09/04/2017  . Vaccine counseling 09/04/2017    Past Surgical History:  Procedure Laterality Date  . ORCHIOPEXY     bilat, in childhood for undescended testicles  . WISDOM TOOTH EXTRACTION          Home Medications    Prior to Admission medications   Medication Sig Start Date End Date Taking? Authorizing Provider  ketoconazole (NIZORAL) 2 % shampoo Apply 1 application topically 2 (two) times a week. 09/06/17   Tysinger, Kermit Balo, PA-C  naproxen (NAPROSYN) 500 MG tablet Take 1 tablet (500 mg total) by mouth 2 (two) times daily. 04/29/18   Georgetta Haber, NP    Family History No family history on file.  Social History Social History   Tobacco Use  . Smoking status: Former Smoker    Years: 1.00  . Smokeless tobacco: Never Used  Substance Use Topics  . Alcohol use: No  . Drug use: No     Allergies   Patient has no known allergies.   Review of Systems Review of Systems  All other systems reviewed and are negative.    Physical Exam Updated Vital Signs BP (!) 155/77 (BP Location: Right Arm)   Pulse 70   Temp 98.1 F (36.7 C) (Oral)   Resp 16   Ht 1.905 m (6\' 3" )   Wt 97.5 kg   SpO2 98%   BMI 26.87 kg/m   Physical Exam  Constitutional: He is oriented to person, place, and time. He appears  well-developed and well-nourished.  Non-toxic appearance.  HENT:  Head: Normocephalic and atraumatic.  Right Ear: Decreased hearing is noted.  Cerumen impaction noted at left external ear canal  Eyes: Pupils are equal, round, and reactive to light. Conjunctivae are normal.  Neck: Normal range of motion.  Cardiovascular: Normal rate.  Pulmonary/Chest: Effort normal.  Neurological: He is alert and oriented to person, place, and time.  Skin: Skin is warm and dry.  Psychiatric: He has a normal mood and affect.  Nursing note and vitals reviewed.    ED Treatments / Results  Labs (all labs ordered are listed, but only abnormal results are displayed) Labs Reviewed - No data to display  EKG None  Radiology No results found.  Procedures Procedures (including critical care time)  Medications Ordered in ED Medications - No data to display   Initial Impression / Assessment and Plan / ED Course  I have reviewed the triage vital signs and the nursing notes.  Pertinent labs & imaging results that were available during my care of the patient were reviewed by me and considered in my medical decision making (see chart for details).     pts ear irriagted by nursing and then eloped  Final Clinical Impressions(s) / ED Diagnoses   Final diagnoses:  None    ED Discharge Orders    None       Lorre Nick, MD 10/10/18 [phone]

## 2018-10-06 NOTE — ED Triage Notes (Addendum)
Sudden loss of hearing in left ear this am, large amount of wax dark in color noted in ear

## 2018-10-06 NOTE — ED Notes (Signed)
Bed: WTR6 Expected date:  Expected time:  Means of arrival:  Comments: 

## 2019-02-05 ENCOUNTER — Ambulatory Visit: Payer: BLUE CROSS/BLUE SHIELD | Admitting: Family Medicine

## 2019-02-05 ENCOUNTER — Telehealth: Payer: Self-pay | Admitting: Medical

## 2019-02-05 NOTE — Telephone Encounter (Signed)
Please send refill on the Nizoral shampoo

## 2019-02-05 NOTE — Telephone Encounter (Signed)
Pt came by office to be seen but we had issues with insurance.  Pt just needs refill on Nizoral shampoo.  Pt will check with his mom and see about his insurance because she handles it but would like a refill if possible and will schedule an appointment once he gets things straight with insurance.

## 2019-02-06 ENCOUNTER — Other Ambulatory Visit: Payer: Self-pay

## 2019-02-06 DIAGNOSIS — B360 Pityriasis versicolor: Secondary | ICD-10-CM

## 2019-02-06 MED ORDER — KETOCONAZOLE 2 % EX SHAM: 1.0000 "application " | MEDICATED_SHAMPOO | CUTANEOUS | 1 refills | Status: DC

## 2019-02-06 NOTE — Telephone Encounter (Signed)
Done refill sent

## 2020-01-19 ENCOUNTER — Ambulatory Visit (INDEPENDENT_AMBULATORY_CARE_PROVIDER_SITE_OTHER): Payer: 59 | Admitting: Medical

## 2020-01-19 ENCOUNTER — Encounter: Payer: Self-pay | Admitting: Medical

## 2020-01-19 ENCOUNTER — Other Ambulatory Visit: Payer: Self-pay

## 2020-01-19 VITALS — BP 128/72 | HR 78 | Temp 98.2°F | Ht 74.0 in | Wt 263.6 lb

## 2020-01-19 DIAGNOSIS — L42 Pityriasis rosea: Secondary | ICD-10-CM | POA: Diagnosis not present

## 2020-01-19 MED ORDER — HYDROXYZINE HCL 10 MG PO TABS: 10.0000 mg | ORAL_TABLET | Freq: Two times a day (BID) | ORAL | 0 refills | Status: DC

## 2020-01-19 NOTE — Progress Notes (Signed)
Subjective:  Ethan Rowland is a 29 y.o. male who presents for Chief Complaint  Patient presents with  . Eczema    all over      Here for rash.   Started about 2 weeks ago.  Girlfriend noticed it before he did.  Started somewhere on lower abdomen or back.  Within days had several spots on lower abdomen and low back.    Just now started itching some, mild.     He has used a new soap recently but its a coconut oil based soap without a lot of additives.   No prior eczema.  No recent illness, no fever, no recent sick contacts, no recent contact with rash.  Since rash started he has been using a coconut oil based lotion.  No other aggravating or relieving factors.    No other c/o.  The following portions of the patient's history were reviewed and updated as appropriate: allergies, current medications, past family history, past medical history, past social history, past surgical history and problem list.  ROS Otherwise as in subjective above    Objective: BP 128/72   Pulse 78   Temp 98.2 F (36.8 C)   Ht 6\' 2"  (1.88 m)   Wt 263 lb 9.6 oz (119.6 kg)   SpO2 98%   BMI 33.84 kg/m   General appearance: alert, no distress, well developed, well nourished, African American male Skin: scattered small oval shaped lesions, slight rough, but not raised, no cleared center, throughout mid chest to lower abdomen and even extending to just below hip anterior and posteriorly, lower back with similar scattered rash.  There possibly is one herald patch on left low back.   No erythema, no bulla, no distinct ringworm or tinea pattern.   All lesions seems similar, not various stages.  Suggestive of pityriasis rosea.   Assessment: Encounter Diagnosis  Name Primary?  . Pityriasis rosea Yes     Plan: Discussed findings, potential differential, including dermatitis, eczema, pityriasis rosea, fungus, but seems most consistent with pityriasis rosea.  Patient Instructions  Your rash appears to be most  likely Pityriasis Rosea   Begin Hydroxyzine tablet by mouth, either bedtime only, or up to twice daily for itching and rash  You could use 1/2 tablet in the morning if this makes you sleepy  Avoid really hot showers for the next few weeks until this rash goes away  This rash will likely take a few WEEKS to completely go away  You are not contagious  Topical treatment:  You can either stick with the Rosehip Body Lotion which is Coconut oil based  Or you could use over the counter Aquaphor lotion or Cetaphil lotion instead  Whatever you use, use daily or twice daily for the next few weeks until rash is gone  If you have a particularly rough red rash in the next week and need something different, let me know and I'll send a steroid cream to the pharmacy     Pityriasis Rosea Pityriasis rosea is a rash that usually appears on the chest, abdomen, and back. It may also appear on the upper arms and upper legs. It usually begins as a single patch, and then more patches start to develop. The rash may cause mild itching, but it normally does not cause other problems. It usually goes away without treatment. However, it may take weeks or months for the rash to go away completely. What are the causes? The cause of this condition is not known.  The condition does not spread from person to person (is not contagious). What increases the risk? This condition is more likely to develop in:  Persons aged 10-35 years.  Pregnant women. It is more common in the spring and fall seasons. What are the signs or symptoms? The main symptom of this condition is a rash.  The rash usually begins with a single oval patch that is larger than the ones that follow. This is called a herald patch. It generally appears a week or more before the rest of the rash appears.  When more patches start to develop, they spread quickly on the chest, abdomen, back, arms, and legs. These patches are smaller than the first  one.  The patches that make up the rash are usually oval-shaped and pink or red in color. They are usually flat but may sometimes be raised so that they can be felt with a finger. They may also be finely crinkled and have a scaly ring around the edge. Some people may have mild itching and nonspecific symptoms, such as:  Nausea.  Loss of appetite.  Difficulty concentrating.  Headache.  Irritability.  Sore throat.  Mild fever. How is this diagnosed? This condition may be diagnosed based on:  Your medical history and a physical exam.  Tests to rule out other causes. This may include blood tests or a test in which a small sample of skin is removed from the rash (biopsy) and checked in a lab. How is this treated?     Treatment is not usually needed for this condition. The rash will often go away on its own in 4-8 weeks. In some cases, a health care provider may recommend or prescribe medicine to reduce itching. Follow these instructions at home:  Take or apply over-the-counter and prescription medicines only as told by your health care provider.  Avoid scratching the affected areas of skin.  Do not take hot baths or use a sauna. Use only warm water when bathing or showering. Heat can increase itching. Adding cornstarch to your bath may help to relieve the itching.  Avoid exposure to the sun and other sources of UV light, such as tanning beds, as told by your health care provider. UV light may help the rash go away but may cause unwanted changes in skin color.  Keep all follow-up visits as told by your health care provider. This is important. Contact a health care provider if:  Your rash does not go away in 8 weeks.  Your rash gets much worse.  You have a fever.  You have swelling or pain in the rash area.  You have fluid, blood, or pus coming from the rash area. Summary  Pityriasis rosea is a rash that usually appears on the trunk of the body. It can also appear on the  upper arms and upper legs.  The rash usually begins with a single oval patch (herald patch) that appears a week or more before the rest of the rash appears. The herald patch is larger than the ones that follow.  The rash may cause mild itching, but it usually does not cause other problems. It usually goes away without treatment in 4-8 weeks.  In some cases, a health care provider may recommend or prescribe medicine to reduce itching. This information is not intended to replace advice given to you by your health care provider. Make sure you discuss any questions you have with your health care provider. Document Revised: 11/26/2017 Document Reviewed: 11/26/2017 Elsevier  Patient Education  [employer].         Ethan Rowland was seen today for eczema.  Diagnoses and all orders for this visit:  Pityriasis rosea  Other orders -     hydrOXYzine (ATARAX/VISTARIL) 10 MG tablet; Take 1 tablet (10 mg total) by mouth 2 (two) times daily.    Follow up: prn

## 2020-01-19 NOTE — Patient Instructions (Addendum)
Your rash appears to be most likely Pityriasis Rosea   Begin Hydroxyzine tablet by mouth, either bedtime only, or up to twice daily for itching and rash  You could use 1/2 tablet in the morning if this makes you sleepy  Avoid really hot showers for the next few weeks until this rash goes away  This rash will likely take a few WEEKS to completely go away  You are not contagious  Topical treatment:  You can either stick with the Rosehip Body Lotion which is Coconut oil based  Or you could use over the counter Aquaphor lotion or Cetaphil lotion instead  Whatever you use, use daily or twice daily for the next few weeks until rash is gone  If you have a particularly rough red rash in the next week and need something different, let me know and I'll send a steroid cream to the pharmacy     Pityriasis Rosea Pityriasis rosea is a rash that usually appears on the chest, abdomen, and back. It may also appear on the upper arms and upper legs. It usually begins as a single patch, and then more patches start to develop. The rash may cause mild itching, but it normally does not cause other problems. It usually goes away without treatment. However, it may take weeks or months for the rash to go away completely. What are the causes? The cause of this condition is not known. The condition does not spread from person to person (is not contagious). What increases the risk? This condition is more likely to develop in:  Persons aged 10-35 years.  Pregnant women. It is more common in the spring and fall seasons. What are the signs or symptoms? The main symptom of this condition is a rash.  The rash usually begins with a single oval patch that is larger than the ones that follow. This is called a herald patch. It generally appears a week or more before the rest of the rash appears.  When more patches start to develop, they spread quickly on the chest, abdomen, back, arms, and legs. These patches  are smaller than the first one.  The patches that make up the rash are usually oval-shaped and pink or red in color. They are usually flat but may sometimes be raised so that they can be felt with a finger. They may also be finely crinkled and have a scaly ring around the edge. Some people may have mild itching and nonspecific symptoms, such as:  Nausea.  Loss of appetite.  Difficulty concentrating.  Headache.  Irritability.  Sore throat.  Mild fever. How is this diagnosed? This condition may be diagnosed based on:  Your medical history and a physical exam.  Tests to rule out other causes. This may include blood tests or a test in which a small sample of skin is removed from the rash (biopsy) and checked in a lab. How is this treated?     Treatment is not usually needed for this condition. The rash will often go away on its own in 4-8 weeks. In some cases, a health care provider may recommend or prescribe medicine to reduce itching. Follow these instructions at home:  Take or apply over-the-counter and prescription medicines only as told by your health care provider.  Avoid scratching the affected areas of skin.  Do not take hot baths or use a sauna. Use only warm water when bathing or showering. Heat can increase itching. Adding cornstarch to your bath may help to  relieve the itching.  Avoid exposure to the sun and other sources of UV light, such as tanning beds, as told by your health care provider. UV light may help the rash go away but may cause unwanted changes in skin color.  Keep all follow-up visits as told by your health care provider. This is important. Contact a health care provider if:  Your rash does not go away in 8 weeks.  Your rash gets much worse.  You have a fever.  You have swelling or pain in the rash area.  You have fluid, blood, or pus coming from the rash area. Summary  Pityriasis rosea is a rash that usually appears on the trunk of the  body. It can also appear on the upper arms and upper legs.  The rash usually begins with a single oval patch (herald patch) that appears a week or more before the rest of the rash appears. The herald patch is larger than the ones that follow.  The rash may cause mild itching, but it usually does not cause other problems. It usually goes away without treatment in 4-8 weeks.  In some cases, a health care provider may recommend or prescribe medicine to reduce itching. This information is not intended to replace advice given to you by your health care provider. Make sure you discuss any questions you have with your health care provider. Document Revised: 11/26/2017 Document Reviewed: 11/26/2017 Elsevier Patient Education  2020 [employer].

## 2020-06-18 ENCOUNTER — Ambulatory Visit: Payer: 59 | Admitting: Medical

## 2020-06-18 ENCOUNTER — Other Ambulatory Visit: Payer: Self-pay

## 2020-06-18 ENCOUNTER — Encounter: Payer: Self-pay | Admitting: Medical

## 2020-06-18 VITALS — BP 134/78 | HR 68 | Ht 74.0 in | Wt 263.8 lb

## 2020-06-18 DIAGNOSIS — R300 Dysuria: Secondary | ICD-10-CM | POA: Diagnosis not present

## 2020-06-18 DIAGNOSIS — Z113 Encounter for screening for infections with a predominantly sexual mode of transmission: Secondary | ICD-10-CM | POA: Diagnosis not present

## 2020-06-18 DIAGNOSIS — S63613A Unspecified sprain of left middle finger, initial encounter: Secondary | ICD-10-CM

## 2020-06-18 DIAGNOSIS — Z7251 High risk heterosexual behavior: Secondary | ICD-10-CM | POA: Diagnosis not present

## 2020-06-18 DIAGNOSIS — M79645 Pain in left finger(s): Secondary | ICD-10-CM

## 2020-06-18 LAB — POCT URINALYSIS DIP (PROADVANTAGE DEVICE)
Bilirubin, UA: NEGATIVE
Blood, UA: NEGATIVE
Glucose, UA: NEGATIVE mg/dL
Ketones, POC UA: NEGATIVE mg/dL
Leukocytes, UA: NEGATIVE
Nitrite, UA: NEGATIVE
Protein Ur, POC: NEGATIVE mg/dL
Specific Gravity, Urine: 1.03
Urobilinogen, Ur: NEGATIVE
pH, UA: 6 (ref 5.0–8.0)

## 2020-06-18 MED ORDER — AZITHROMYCIN 500 MG PO TABS
1000.0000 mg | ORAL_TABLET | Freq: Every day | ORAL | 0 refills | Status: DC
Start: 2020-06-18 — End: 2020-06-22

## 2020-06-18 NOTE — Progress Notes (Signed)
Subjective:  ASAHD CAN is a 29 y.o. male who presents for Chief Complaint  Patient presents with  . Groin Burn    with urination      Here for urinary symptoms.  He notes 1 week history of burning with urination.  No penile discharge no rash, no testicle pain or swelling, no injury or trauma, no cloudy urine no blood in the urine, no fever, no abdominal pain, no nausea or vomiting.  No diarrhea.    Sexually active with one partner.  They have had an off and on again relationship for 3 years.  She has no current symptoms.  They do not use condoms.  He also has a concern with his left middle finger.  2 months ago he was playing basketball and when he went to block someone else shooting the ball his finger jammed into their hand.  He has had pain in the middle finger since including pain with flexing the hand into a fist.  Had swelling initially but that improved.  But the pain still persist.  It hurts to make a full fist.  No numbness or tingling.  No weakness.  No other aggravating or relieving factors. No other complaint.   The following portions of the patient's history were reviewed and updated as appropriate: allergies, current medications, past family history, past medical history, past social history, past surgical history and problem list.  ROS Otherwise as in subjective above    Objective: BP 134/78   Pulse 68   Ht 6\' 2"  (1.88 m)   Wt 263 lb 12.8 oz (119.7 kg)   SpO2 98%   BMI 33.87 kg/m   General appearance: alert, no distress, well developed, well nourished, African-American male GU: Normal male, circumcised, no mass no lymphadenopathy no rash, no hernia Left middle finger tender over PIP and proximal phalanx, pain with flexion in the same area otherwise no deformity no swelling no other pain, range of motion seems relatively full Hands neurovascularly intact     Assessment: Encounter Diagnoses  Name Primary?  . Dysuria Yes  . Screen for STD (sexually  transmitted disease)   . Unprotected sex   . Pain of left middle finger   . Sprain of left middle finger, unspecified site of digit, initial encounter      Plan: We discussed symptoms and concerns.  Urinalysis normal.  We will screen for STD.  Will treat for chlamydia empirically.  We discussed safe sex, condom use, prevention  Left middle finger strain injury, pain.  Given the 71-month history and not improving we will send for x-ray.  We discussed splinting with over-the-counter splint, rest, avoid reinjury, short-term NSAID over-the-counter  Jaidev was seen today for groin burn.  Diagnoses and all orders for this visit:  Dysuria -     POCT Urinalysis DIP (Proadvantage Device)  Screen for STD (sexually transmitted disease) -     HIV Antibody (routine testing w rflx) -     RPR -     Chlamydia/Gonococcus/Trichomonas, NAA  Unprotected sex -     HIV Antibody (routine testing w rflx) -     RPR -     Chlamydia/Gonococcus/Trichomonas, NAA  Pain of left middle finger -     DG Finger Middle Left; Future  Sprain of left middle finger, unspecified site of digit, initial encounter  Other orders -     azithromycin (ZITHROMAX) 500 MG tablet; Take 2 tablets (1,000 mg total) by mouth daily.    Follow up:  Pending labs and x-ray

## 2020-06-18 NOTE — Patient Instructions (Signed)
Please go to [facility] for your finger xray.   Their hours are 8am - 4:30 pm Monday - Friday.  Take your insurance card with you.  [facility] [phone]  [address], [address] [place], [place] [address]  [address] [place], [place] [address] 

## 2020-06-19 LAB — HIV ANTIBODY (ROUTINE TESTING W REFLEX): HIV Screen 4th Generation wRfx: NONREACTIVE

## 2020-06-19 LAB — RPR: RPR Ser Ql: NONREACTIVE

## 2020-06-21 LAB — CHLAMYDIA/GONOCOCCUS/TRICHOMONAS, NAA
Chlamydia by NAA: NEGATIVE
Gonococcus by NAA: NEGATIVE
Trich vag by NAA: NEGATIVE

## 2020-06-22 ENCOUNTER — Other Ambulatory Visit: Payer: Self-pay | Admitting: Medical

## 2020-06-22 MED ORDER — SULFAMETHOXAZOLE-TRIMETHOPRIM 800-160 MG PO TABS
1.0000 | ORAL_TABLET | Freq: Two times a day (BID) | ORAL | 0 refills | Status: DC
Start: 2020-06-22 — End: 2020-07-13

## 2020-07-13 ENCOUNTER — Other Ambulatory Visit: Payer: Self-pay | Admitting: Medical

## 2020-07-13 MED ORDER — METRONIDAZOLE 500 MG PO TABS
ORAL_TABLET | ORAL | 0 refills | Status: DC
Start: 2020-07-13 — End: 2021-08-16

## 2021-05-04 ENCOUNTER — Encounter: Payer: Self-pay | Admitting: Medical

## 2021-05-04 ENCOUNTER — Ambulatory Visit (INDEPENDENT_AMBULATORY_CARE_PROVIDER_SITE_OTHER): Payer: 59 | Admitting: Medical

## 2021-05-04 ENCOUNTER — Other Ambulatory Visit: Payer: Self-pay

## 2021-05-04 VITALS — BP 124/70 | HR 67 | Wt 283.4 lb

## 2021-05-04 DIAGNOSIS — B081 Molluscum contagiosum: Secondary | ICD-10-CM

## 2021-05-04 MED ORDER — IMIQUIMOD 5 % EX CREA: TOPICAL_CREAM | CUTANEOUS | 2 refills | Status: DC

## 2021-05-04 MED ORDER — IMIQUIMOD 5 % EX CREA
TOPICAL_CREAM | CUTANEOUS | 0 refills | Status: DC
Start: 2021-05-04 — End: 2021-05-04

## 2021-05-04 NOTE — Patient Instructions (Signed)
Molluscum Contagiosum, Adult Molluscum contagiosum is a skin infection that can cause a rash. The rash may go away on its own, or it may need to be treated with a procedure or medicine. What are the causes? This condition is caused by a virus. The virus is contagious. This means that it can spread from person to person. It can spread through:  Skin-to-skin contact with an infected person.  Contact with an object that has the virus on it, such as a towel or clothing.  Sexual activity. What increases the risk? You may be more likely to develop this condition if:  You live in an area where the weather is moist and warm.  You have a weak disease-fighting system (immune system). What are the signs or symptoms? The main symptom of this condition is a painless rash that appears 2-7 weeks after exposure to the virus. The rash is made up of small, dome-shaped bumps on the skin. The bumps may:  Affect the genitals, thighs, face, neck, or abdomen.  Be pink or flesh-colored.  Appear one by one or in groups.  Range from the size of a pinhead to the size of a pencil eraser.  Feel firm, smooth, and waxy.  Have a pit in the middle.  Itch. For most people, the rash does not itch.   How is this diagnosed? This condition may be diagnosed based on:  Your symptoms and medical history.  A physical exam.  Scraping the bumps to collect a skin sample for testing. How is this treated? The rash usually goes away within 2 months, but it can sometimes take 6-12 months for it to clear completely. For some people, the rash may go away on its own without treatment. In some cases, treatment may be needed to keep the virus from infecting other people or to keep the rash from spreading to other parts of your body. Treatment may also be done if you have anxiety or stress because of the way the rash looks. If needed, treatment may include:  Surgery to remove the bumps by freezing them (cryosurgery).  A  procedure to scrape off the bumps (curettage).  A procedure to remove the bumps with a laser.  Putting medicine on the bumps (topical treatment). Follow these instructions at home:  Take or apply over-the-counter and prescription medicines only as told by your health care provider.  Do not scratch or pick at the bumps. Scratching or picking can cause the rash to spread to other parts of your body. How is this prevented? As long as you have bumps on your skin, the infection can spread to other people. To prevent this from happening:  Do not share clothing or towels with others until the bumps go away.  Do not use a public swimming pool, sauna, or shower until the bumps go away.  Avoid close contact with others until the bumps go away. This includes sexual contact.  Wash your hands often with soap and water. If soap and water are not available, use hand sanitizer.  Cover the bumps with clothing or a bandage when you will be near other people. Contact a health care provider if:  The bumps are spreading.  The bumps are becoming red and sore.  The bumps have not gone away after 12 months. Summary  Molluscum contagiosum is a skin infection that can cause a rash made up of small, dome-shaped bumps.  The infection is caused by a virus.  The rash usually goes away within 2  months, but it can sometimes take 6-12 months for it to clear completely.  The rash often goes away on its own. However, treatment is sometimes recommended to keep the virus from infecting other people or to keep it from spreading to other parts of your body. This information is not intended to replace advice given to you by your health care provider. Make sure you discuss any questions you have with your health care provider. Document Revised: 08/02/2020 Document Reviewed: 08/02/2020 Elsevier Patient Education  2021 [employer].

## 2021-05-04 NOTE — Progress Notes (Signed)
Subjective:  Ethan Rowland is a 30 y.o. male who presents for Chief Complaint  Patient presents with  . bumps    On abdomen      Here for a scattering of bumps on lower abdomen close to belly button. Started 2 weeks ago.  Not itchy.  He thinks he spread some to his girlfriend.  No other contacts with similar.  Has 1 girlfriend.  No other aggravating or relieving factors.    No other c/o.  The following portions of the patient's history were reviewed and updated as appropriate: allergies, current medications, past family history, past medical history, past social history, past surgical history and problem list.  ROS Otherwise as in subjective above   Objective: BP 124/70   Pulse 67   Wt 283 lb 6.4 oz (128.5 kg)   SpO2 97%   BMI 36.39 kg/m   General appearance: alert, no distress, well developed, well nourished Skin: approximately 16-20, 2-3 mm raised papular pink/red lesions with somewhat depressed central area c/w molluscum contagiosum    Assessment: Encounter Diagnosis  Name Primary?  . Molluscum contagiosum Yes     Plan: We discussed the findings.  We discussed that these are contagious with skin to skin contact.  Discussed options for therapy.  We discussed various treatment options including cryotherapy, topical solution, referral to dermatology for other topical and office treatment.  He would like to try the topical solution at home first.  Discussed Aldara cream, risk and benefits of use, proper use.  Advised if not much improvement within the next 2 weeks to call back for referral to dermatology  Ethan Rowland was seen today for bumps.  Diagnoses and all orders for this visit:  Molluscum contagiosum  Other orders -     Discontinue: imiquimod (ALDARA) 5 % cream; Apply topically 3 (three) times a week. -     imiquimod (ALDARA) 5 % cream; Apply topically 3 (three) times a week.    Follow up: 2-3 weeks

## 2021-08-16 ENCOUNTER — Other Ambulatory Visit: Payer: Self-pay

## 2021-08-16 ENCOUNTER — Ambulatory Visit (INDEPENDENT_AMBULATORY_CARE_PROVIDER_SITE_OTHER): Payer: 59 | Admitting: Medical

## 2021-08-16 VITALS — BP 150/80 | HR 95 | Temp 98.5°F | Wt 276.0 lb

## 2021-08-16 DIAGNOSIS — B081 Molluscum contagiosum: Secondary | ICD-10-CM | POA: Diagnosis not present

## 2021-08-16 NOTE — Patient Instructions (Signed)
I was able to get you an appointment time Monday 08/22/2021 at 2pm.    Please make this appoint as it is very difficult to get appointments within a reasonable time with dermatology.   [facility] [address], [place], [place] [address] [phone]

## 2021-08-16 NOTE — Progress Notes (Signed)
Subjective:  Ethan Rowland is a 30 y.o. male who presents for Chief Complaint  Patient presents with   bumps on stomach    Bumps on stomach     Here for recheck on scattering of bumps on lower abdomen close to belly button ,molluscum that we discussed at last visit in May.  He ended up using Aldara cream for a solid month and saw no improvement at all.  So no change in symptoms.   No other aggravating or relieving factors.    No other c/o.  The following portions of the patient's history were reviewed and updated as appropriate: allergies, current medications, past family history, past medical history, past social history, past surgical history and problem list.  ROS Otherwise as in subjective above   Objective: BP (!) 150/80   Pulse 95   Temp 98.5 F (36.9 C)   Wt 276 lb (125.2 kg)   BMI 35.44 kg/m   General appearance: alert, no distress, well developed, well nourished Skin: approximately 16-20, 2-3 mm raised papular pink/red lesions with somewhat depressed central area c/w molluscum contagiosum    Assessment: Encounter Diagnosis  Name Primary?   Molluscum contagiosum Yes      Plan: Given no improvement at all since 04/2021 with Aldara cream, I called and got him appt with [facility] next Monday 08/22/21 at 2pm for further treatment options.   [name] was seen today for bumps on stomach.  Diagnoses and all orders for this visit:  Molluscum contagiosum    Follow up: 2-3 weeks

## 2023-11-07 ENCOUNTER — Encounter: Payer: 59 | Admitting: Medical

## 2023-12-25 ENCOUNTER — Ambulatory Visit: Payer: No Typology Code available for payment source | Admitting: Medical

## 2023-12-25 ENCOUNTER — Encounter: Payer: Self-pay | Admitting: Medical

## 2023-12-25 VITALS — BP 120/80 | HR 89 | Ht 75.0 in | Wt 275.8 lb

## 2023-12-25 DIAGNOSIS — Z7185 Encounter for immunization safety counseling: Secondary | ICD-10-CM | POA: Diagnosis not present

## 2023-12-25 DIAGNOSIS — Z13220 Encounter for screening for lipoid disorders: Secondary | ICD-10-CM

## 2023-12-25 DIAGNOSIS — Z131 Encounter for screening for diabetes mellitus: Secondary | ICD-10-CM

## 2023-12-25 DIAGNOSIS — Z0000 Encounter for general adult medical examination without abnormal findings: Secondary | ICD-10-CM | POA: Diagnosis not present

## 2023-12-25 DIAGNOSIS — Z136 Encounter for screening for cardiovascular disorders: Secondary | ICD-10-CM

## 2023-12-25 DIAGNOSIS — Z113 Encounter for screening for infections with a predominantly sexual mode of transmission: Secondary | ICD-10-CM

## 2023-12-25 LAB — LIPID PANEL

## 2023-12-25 NOTE — Progress Notes (Signed)
 Subjective:   HPI  Ethan Rowland is a 33 y.o. male who presents for Chief Complaint  Patient presents with   nonfasting cpe    Nonfasting cpe, no concerns, declines all shots today    Patient Care Team: Zebulen Simonis, Alm GORMAN RIGGERS as PCP - General (Family Medicine) Sees dentist   Concerns: Here for well visit.    Reviewed their medical, surgical, family, social, medication, and allergy history and updated chart as appropriate.  No Known Allergies  History reviewed. No pertinent past medical history.  No current outpatient medications on file prior to visit.   No current facility-administered medications on file prior to visit.     No current outpatient medications on file.  Family History  Problem Relation Age of Onset   Hyperlipidemia Mother    Hypertension Mother    Cancer Mother        breast   Hypertension Maternal Grandmother    Cancer Maternal Grandmother        breast   Heart disease Neg Hx    Diabetes Neg Hx     Past Surgical History:  Procedure Laterality Date   ORCHIOPEXY     bilat, in childhood for undescended testicles   WISDOM TOOTH EXTRACTION     Review of Systems  Constitutional:  Negative for chills, fever, malaise/fatigue and weight loss.  HENT:  Negative for congestion, ear pain, hearing loss, sore throat and tinnitus.   Eyes:  Negative for blurred vision, pain and redness.  Respiratory:  Negative for cough, hemoptysis and shortness of breath.   Cardiovascular:  Negative for chest pain, palpitations, orthopnea, claudication and leg swelling.  Gastrointestinal:  Negative for abdominal pain, blood in stool, constipation, diarrhea, nausea and vomiting.  Genitourinary:  Negative for dysuria, flank pain, frequency, hematuria and urgency.  Musculoskeletal:  Negative for falls, joint pain and myalgias.  Skin:  Negative for itching and rash.  Neurological:  Negative for dizziness, tingling, speech change, weakness and headaches.   Endo/Heme/Allergies:  Negative for polydipsia. Does not bruise/bleed easily.  Psychiatric/Behavioral:  Negative for depression and memory loss. The patient is not nervous/anxious and does not have insomnia.      Objective:  BP 120/80   Pulse 89   Ht 6' 3 (1.905 m)   Wt 275 lb 12.8 oz (125.1 kg)   BMI 34.47 kg/m   Wt Readings from Last 3 Encounters:  12/25/23 275 lb 12.8 oz (125.1 kg)  08/16/21 276 lb (125.2 kg)  05/04/21 283 lb 6.4 oz (128.5 kg)    General appearance: alert, no distress, WD/WN, African American male Skin: unremarkable, tattoo left volar wrist HEENT: normocephalic, conjunctiva/corneas normal, sclerae anicteric, PERRLA, EOMi, nares patent, no discharge or erythema, pharynx normal Oral cavity: MMM, tongue normal, teeth normal Neck: supple, no lymphadenopathy, no thyromegaly, no masses, normal ROM, no bruits Chest: non tender, normal shape and expansion Heart: RRR, normal S1, S2, no murmurs Lungs: CTA bilaterally, no wheezes, rhonchi, or rales Abdomen: +bs, soft, non tender, non distended, no masses, no hepatomegaly, no splenomegaly, no bruits Back: non tender, normal ROM, no scoliosis Musculoskeletal: upper extremities non tender, no obvious deformity, normal ROM throughout, lower extremities non tender, no obvious deformity, normal ROM throughout Extremities: no edema, no cyanosis, no clubbing Pulses: 2+ symmetric, upper and lower extremities, normal cap refill Neurological: alert, oriented x 3, CN2-12 intact, strength normal upper extremities and lower extremities, sensation normal throughout, DTRs 2+ throughout, no cerebellar signs, gait normal Psychiatric: normal affect, behavior normal, pleasant  GU: normal male external genitalia,circumcised, nontender, no masses, no hernia, no lymphadenopathy Rectal: deferred    Assessment and Plan :   Encounter Diagnoses  Name Primary?   Encounter for health maintenance examination in adult Yes   Vaccine counseling     Encounter for lipid screening for cardiovascular disease    Screening for diabetes mellitus    Screen for STD (sexually transmitted disease)     This visit was a preventative care visit, also known as wellness visit or routine physical.   Topics typically include healthy lifestyle, diet, exercise, preventative care, vaccinations, sick and well care, proper use of emergency dept and after hours care, as well as other concerns.     Separate significant issues discussed: Counseled on weight, healthy diet, exercise    General Recommendations: Continue to return yearly for your annual wellness and preventative care visits.  This gives us  a chance to discuss healthy lifestyle, exercise, vaccinations, review your chart record, and perform screenings where appropriate.  I recommend you see your eye doctor yearly for routine vision care.  I recommend you see your dentist yearly for routine dental care including hygiene visits twice yearly.   Vaccination  Immunization History  Administered Date(s) Administered   HPV Quadrivalent 08/19/2014   Influenza,inj,Quad PF,6+ Mos 08/19/2014   Influenza-Unspecified 10/05/2016   Tdap 03/24/2008    Vaccine recommendations: Tdap and HPV  Vaccines administered today: None, declines   Screening for cancer: Colon cancer screening: Age 41  Testicular cancer screening You should do a monthly self testicular exam if you are between 4-104 years old, and we typically do a testicular exam on the yearly physical for this same age group.   Prostate Cancer screening: The recommended prostate cancer screening test is a blood test called the prostate-specific antigen (PSA) test. PSA is a protein that is made in the prostate. As you age, your prostate naturally produces more PSA. Abnormally high PSA levels may be caused by: Prostate cancer. An enlarged prostate that is not caused by cancer (benign prostatic hyperplasia, or BPH). This condition is very  common in older men. A prostate gland infection (prostatitis) or urinary tract infection. Certain medicines such as male hormones (like testosterone) or other medicines that raise testosterone levels. A rectal exam may be done as part of prostate cancer screening to help provide information about the size of your prostate gland. When a rectal exam is performed, it should be done after the PSA level is drawn to avoid any effect on the results.   Skin cancer screening: Check your skin regularly for new changes, growing lesions, or other lesions of concern Come in for evaluation if you have skin lesions of concern.   Lung cancer screening: If you have a greater than 20 pack year history of tobacco use, then you may qualify for lung cancer screening with a chest CT scan.   Please call your insurance company to inquire about coverage for this test.   Pancreatic cancer:  no current screening test is available or routinely recommended. (risk factors: smoking, overweight or obese, diabetes, chronic pancreatitis, work exposure - dry cleaning, metal working, 33yo>, M>F, Tree Surgeon, family hx/o, hereditary breast, ovarian, melanoma, lynch, peutz-jeghers).  Symptoms: jaundice, dark urine, light color or greasy stools, itchy skin, belly or back pain, weight loss, poor appetite, nausea, vomiting, liver enlargement, DVT/blood clots.   We currently don't have screenings for other cancers besides breast, cervical, colon, and lung cancers.  If you have a strong family history of  cancer or have other cancer screening concerns, please let me know.  Genetic testing referral is an option for individuals with high cancer risk in the family.  There are some other cancer screenings in development currently.   Bone health: Get at least 150 minutes of aerobic exercise weekly Get weight bearing exercise at least once weekly Bone density test:  A bone density test is an imaging test that uses a type of X-ray to  measure the amount of calcium and other minerals in your bones. The test may be used to diagnose or screen you for a condition that causes weak or thin bones (osteoporosis), predict your risk for a broken bone (fracture), or determine how well your osteoporosis treatment is working. The bone density test is recommended for females 65 and older, or females or males <65 if certain risk factors such as thyroid disease, long term use of steroids such as for asthma or rheumatological issues, vitamin D deficiency, estrogen deficiency, family history of osteoporosis, self or family history of fragility fracture in first degree relative.    Heart health: Get at least 150 minutes of aerobic exercise weekly Limit alcohol It is important to maintain a healthy blood pressure and healthy cholesterol numbers  Heart disease screening: Screening for heart disease includes screening for blood pressure, fasting lipids, glucose/diabetes screening, BMI height to weight ratio, reviewed of smoking status, physical activity, and diet.    Goals include blood pressure 120/80 or less, maintaining a healthy lipid/cholesterol profile, preventing diabetes or keeping diabetes numbers under good control, not smoking or using tobacco products, exercising most days per week or at least 150 minutes per week of exercise, and eating healthy variety of fruits and vegetables, healthy oils, and avoiding unhealthy food choices like fried food, fast food, high sugar and high cholesterol foods.    Other tests may possibly include EKG test, CT coronary calcium score, echocardiogram, exercise treadmill stress test.       Medical care options: I recommend you continue to seek care here first for routine care.  We try really hard to have available appointments Monday through Friday daytime hours for sick visits, acute visits, and physicals.  Urgent care should be used for after hours and weekends for significant issues that cannot wait till  the next day.  The emergency department should be used for significant potentially life-threatening emergencies.  The emergency department is expensive, can often have long wait times for less significant concerns, so try to utilize primary care, urgent care, or telemedicine when possible to avoid unnecessary trips to the emergency department.  Virtual visits and telemedicine have been introduced since the pandemic started in 2020, and can be convenient ways to receive medical care.  We offer virtual appointments as well to assist you in a variety of options to seek medical care.   Legal  Take the time to do a last will and testament, Advanced Directives including Health Care Power of Attorney and Living Will documents.  Don't leave your family with burdens that can be handled ahead of time.   Spiritual and Emotional Health Keeping a healthy spiritual life can help you better manage your physical health. Your spiritual life can help you to cope with any issues that may arise with your physical health.  Balance can keep us  healthy and help us  to recover.  If you are struggling with your spiritual health there are questions that you may want to ask yourself:  What makes me feel most complete? When do  I feel most connected to the rest of the world? Where do I find the most inner strength? What am I doing when I feel whole?  Helpful tips: Being in nature. Some people feel very connected and at peace when they are walking outdoors or are outside. Helping others. Some feel the largest sense of wellbeing when they are of service to others. Being of service can take on many forms. It can be doing volunteer work, being kind to strangers, or offering a hand to a friend in need. Gratitude. Some people find they feel the most connected when they remain grateful. They may make lists of all the things they are grateful for or say a thank you out loud for all they have.    Emotional Health Are you in tune  with your emotional health?  Check out this link: [email or web address]    Amiir was seen today for nonfasting cpe.  Diagnoses and all orders for this visit:  Encounter for health maintenance examination in adult -     Comprehensive metabolic panel -     CBC -     Lipid panel -     Hemoglobin A1c -     RPR+HIV+GC+CT Panel -     Hepatitis C antibody -     Hepatitis B surface antigen  Vaccine counseling  Encounter for lipid screening for cardiovascular disease -     Lipid panel  Screening for diabetes mellitus -     Hemoglobin A1c  Screen for STD (sexually transmitted disease) -     RPR+HIV+GC+CT Panel -     Hepatitis C antibody -     Hepatitis B surface antigen     Follow-up pending labs, yearly for physical

## 2023-12-26 LAB — COMPREHENSIVE METABOLIC PANEL
ALT: 34 IU/L (ref 0–44)
AST: 21 IU/L (ref 0–40)
Albumin: 4.7 g/dL (ref 4.3–5.2)
Alkaline Phosphatase: 71 IU/L (ref 44–121)
BUN/Creatinine Ratio: 12 (ref 9–20)
BUN: 11 mg/dL (ref 6–20)
Bilirubin Total: 0.2 mg/dL (ref 0.0–1.2)
CO2: 21 mmol/L (ref 20–29)
Calcium: 9.9 mg/dL (ref 8.7–10.2)
Chloride: 102 mmol/L (ref 96–106)
Creatinine, Ser: 0.92 mg/dL (ref 0.76–1.27)
Globulin, Total: 2.5 g/dL (ref 1.5–4.5)
Glucose: 110 mg/dL — ABNORMAL HIGH (ref 70–99)
Potassium: 4 mmol/L (ref 3.5–5.2)
Sodium: 140 mmol/L (ref 134–144)
Total Protein: 7.2 g/dL (ref 6.0–8.5)
eGFR: 115 mL/min/{1.73_m2} (ref >59)

## 2023-12-26 LAB — RPR+HIV+GC+CT PANEL

## 2023-12-26 LAB — LIPID PANEL
Cholesterol, Total: 180 mg/dL (ref 100–199)
HDL: 39 mg/dL — ABNORMAL LOW (ref >39)
LDL CALC COMMENT:: 4.6 ratio (ref 0.0–5.0)
LDL Chol Calc (NIH): 117 mg/dL — ABNORMAL HIGH (ref 0–99)
Triglycerides: 132 mg/dL (ref 0–149)
VLDL Cholesterol Cal: 24 mg/dL (ref 5–40)

## 2023-12-26 LAB — HEMOGLOBIN A1C
Est. average glucose Bld gHb Est-mCnc: 131 mg/dL
Hgb A1c MFr Bld: 6.2 % — ABNORMAL HIGH (ref 4.8–5.6)

## 2023-12-26 LAB — CBC
Hematocrit: 44.7 % (ref 37.5–51.0)
Hemoglobin: 14.8 g/dL (ref 13.0–17.7)
MCH: 28.9 pg (ref 26.6–33.0)
MCHC: 33.1 g/dL (ref 31.5–35.7)
MCV: 87 fL (ref 79–97)
Platelets: 378 10*3/uL (ref 150–450)
RBC: 5.12 x10E6/uL (ref 4.14–5.80)
RDW: 12 % (ref 11.6–15.4)
WBC: 7.1 10*3/uL (ref 3.4–10.8)

## 2023-12-26 LAB — HEPATITIS B SURFACE ANTIGEN

## 2023-12-26 LAB — HEPATITIS C ANTIBODY

## 2023-12-26 NOTE — Progress Notes (Signed)
 Results sent through MyChart

## 2023-12-27 NOTE — Progress Notes (Signed)
Results sent through MyChart
